# Patient Record
Sex: Female | Born: 1975 | Race: White | Hispanic: No | Marital: Married
Health system: Southern US, Community
[De-identification: ages and names within clinical notes are randomized; demographics above are authoritative.]

## PROBLEM LIST (undated history)

## (undated) DIAGNOSIS — N200 Calculus of kidney: Secondary | ICD-10-CM

## (undated) HISTORY — DX: Calculus of kidney: N20.0

---

## 2004-03-02 ENCOUNTER — Inpatient Hospital Stay: Payer: Self-pay | Admitting: Obstetrics & Gynecology

## 2006-11-04 ENCOUNTER — Encounter: Payer: Self-pay | Admitting: Maternal & Fetal Medicine

## 2006-11-28 ENCOUNTER — Observation Stay: Payer: Self-pay | Admitting: Unknown Physician Specialty

## 2006-12-02 ENCOUNTER — Encounter: Payer: Self-pay | Admitting: Maternal & Fetal Medicine

## 2007-01-05 ENCOUNTER — Observation Stay: Payer: Self-pay | Admitting: Unknown Physician Specialty

## 2007-01-06 ENCOUNTER — Ambulatory Visit: Payer: Self-pay | Admitting: Unknown Physician Specialty

## 2007-01-07 ENCOUNTER — Inpatient Hospital Stay: Payer: Self-pay | Admitting: Obstetrics and Gynecology

## 2007-06-02 ENCOUNTER — Emergency Department: Payer: Self-pay | Admitting: Emergency Medicine

## 2008-09-27 IMAGING — CR DG FOREARM 2V*L*
1 series · 2 of 2 positions shown · non-contrast
Comparison: none

REASON FOR EXAM: Fall
COMMENTS:

[Series 1: view not recorded · 0.17mm/px · 2 of 2 slices shown]
[im 1/2]
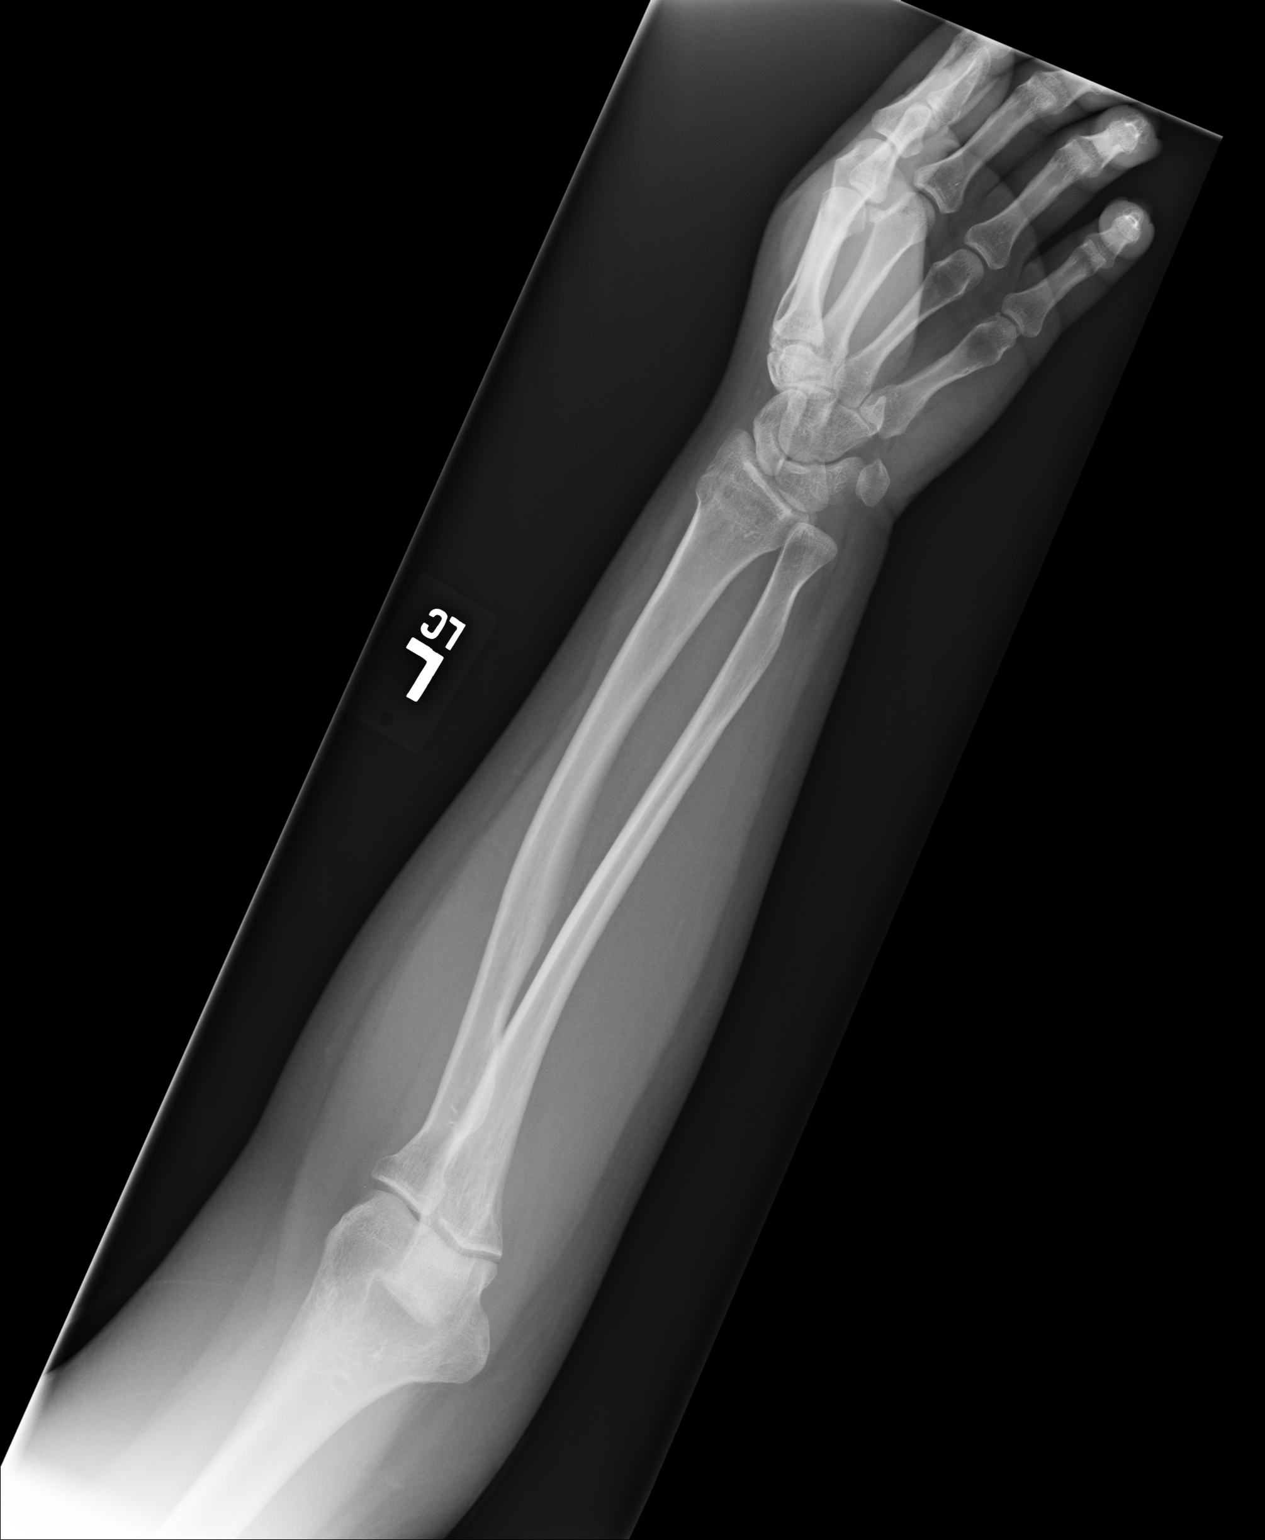
[im 2/2]
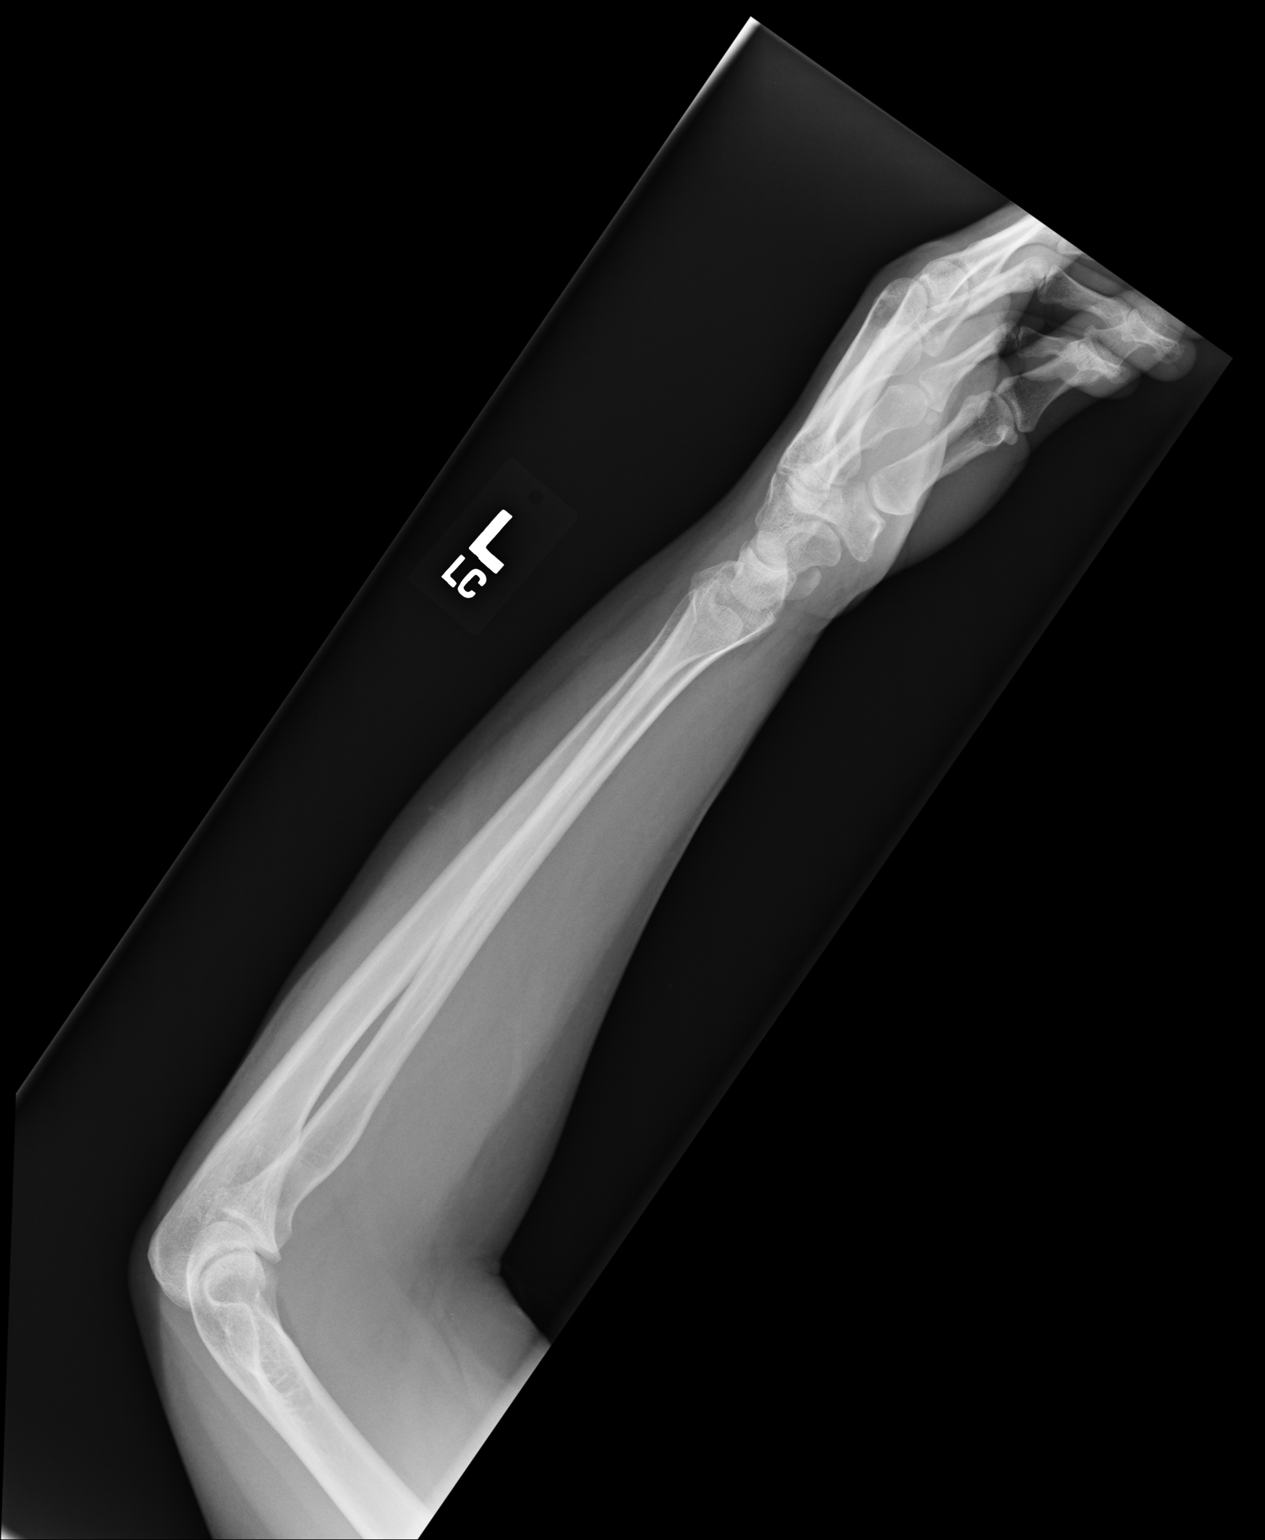

[2 of 2 positions shown; findings below may reference images not displayed]

PROCEDURE:     DXR - DXR FOREARM LEFT  - June 02, 2007 [DATE]

RESULT:     Two views of the forearm were obtained.

There is deformity of the distal LEFT radius. The findings could be
secondary to an impaction type fracture. The appearance also could be old
and secondary to residual change from prior trauma. Correlation with
clinical findings is needed. On the current exam, no definite associated
soft tissue swelling is seen. The osseous structures of the forearm
otherwise are normal in appearance.
IMPRESSION: There is deformity of the distal LEFT radius which is likely
old. Nonetheless, correlation with clinical findings is needed since an
acute impaction fracture cannot be totally excluded on the basis of this
exam. If the patient is symptomatic in this region, routine wrist views are
recommended.

## 2015-01-12 ENCOUNTER — Emergency Department: Payer: Managed Care, Other (non HMO)

## 2015-01-12 ENCOUNTER — Emergency Department
Admission: EM | Admit: 2015-01-12 | Discharge: 2015-01-12 | Disposition: A | Discharge status: Home / Self Care | Payer: Managed Care, Other (non HMO) | Attending: Emergency Medicine | Admitting: Emergency Medicine

## 2015-01-12 DIAGNOSIS — N200 Calculus of kidney: Secondary | ICD-10-CM | POA: Insufficient documentation

## 2015-01-12 DIAGNOSIS — R109 Unspecified abdominal pain: Secondary | ICD-10-CM | POA: Diagnosis present

## 2015-01-12 LAB — CBC WITH DIFFERENTIAL/PLATELET
BASOS ABS: 0.1 10*3/uL (ref 0–0.1)
BASOS PCT: 0 %
EOS PCT: 0 %
Eosinophils Absolute: 0 10*3/uL (ref 0–0.7)
HCT: 44 % (ref 35.0–47.0)
Hemoglobin: 14.3 g/dL (ref 12.0–16.0)
Lymphocytes Relative: 10 %
Lymphs Abs: 1.7 10*3/uL (ref 1.0–3.6)
MCH: 28.4 pg (ref 26.0–34.0)
MCHC: 32.5 g/dL (ref 32.0–36.0)
MCV: 87.6 fL (ref 80.0–100.0)
MONO ABS: 0.6 10*3/uL (ref 0.2–0.9)
MONOS PCT: 4 %
Neutro Abs: 13.6 10*3/uL — ABNORMAL HIGH (ref 1.4–6.5)
Neutrophils Relative %: 86 %
PLATELETS: 239 10*3/uL (ref 150–440)
RBC: 5.03 MIL/uL (ref 3.80–5.20)
RDW: 13 % (ref 11.5–14.5)
WBC: 16 10*3/uL — ABNORMAL HIGH (ref 3.6–11.0)

## 2015-01-12 LAB — URINALYSIS COMPLETE WITH MICROSCOPIC (ARMC ONLY)
BILIRUBIN URINE: NEGATIVE
Bacteria, UA: NONE SEEN
GLUCOSE, UA: NEGATIVE mg/dL
LEUKOCYTES UA: NEGATIVE
NITRITE: NEGATIVE
PROTEIN: 100 mg/dL — AB
SPECIFIC GRAVITY, URINE: 1.027 (ref 1.005–1.030)
pH: 5 (ref 5.0–8.0)

## 2015-01-12 LAB — COMPREHENSIVE METABOLIC PANEL
ALBUMIN: 4.2 g/dL (ref 3.5–5.0)
ALT: 22 U/L (ref 14–54)
ANION GAP: 9 (ref 5–15)
AST: 24 U/L (ref 15–41)
Alkaline Phosphatase: 60 U/L (ref 38–126)
BUN: 18 mg/dL (ref 6–20)
CHLORIDE: 110 mmol/L (ref 101–111)
CO2: 21 mmol/L — ABNORMAL LOW (ref 22–32)
Calcium: 8.9 mg/dL (ref 8.9–10.3)
Creatinine, Ser: 0.94 mg/dL (ref 0.44–1.00)
GFR calc Af Amer: >60 mL/min (ref >60)
Glucose, Bld: 143 mg/dL — ABNORMAL HIGH (ref 65–99)
POTASSIUM: 3.9 mmol/L (ref 3.5–5.1)
Sodium: 140 mmol/L (ref 135–145)
TOTAL PROTEIN: 7.5 g/dL (ref 6.5–8.1)
Total Bilirubin: 0.2 mg/dL — ABNORMAL LOW (ref 0.3–1.2)

## 2015-01-12 LAB — LIPASE, BLOOD: LIPASE: 31 U/L (ref 11–51)

## 2015-01-12 MED ORDER — SODIUM CHLORIDE 0.9 % IV BOLUS (SEPSIS)
1000.0000 mL | Freq: Once | INTRAVENOUS | Status: AC
  Administered 2015-01-12: 1000 mL via INTRAVENOUS

## 2015-01-12 MED ORDER — OXYCODONE-ACETAMINOPHEN 5-325 MG PO TABS
1.0000 | ORAL_TABLET | ORAL | Status: DC | PRN
Start: 2015-01-12 — End: 2023-12-12

## 2015-01-12 MED ORDER — ONDANSETRON HCL 4 MG/2ML IJ SOLN
INTRAMUSCULAR | Status: AC
  Administered 2015-01-12: 4 mg via INTRAVENOUS
  Filled 2015-01-12: qty 2

## 2015-01-12 MED ORDER — IOHEXOL 240 MG/ML SOLN
25.0000 mL | Freq: Once | INTRAMUSCULAR | Status: AC | PRN
  Administered 2015-01-12: 25 mL via ORAL

## 2015-01-12 MED ORDER — ONDANSETRON HCL 4 MG/2ML IJ SOLN
4.0000 mg | Freq: Once | INTRAMUSCULAR | Status: AC
  Administered 2015-01-12: 4 mg via INTRAVENOUS

## 2015-01-12 MED ORDER — ONDANSETRON 4 MG PO TBDP: 4.0000 mg | ORAL_TABLET | Freq: Three times a day (TID) | ORAL | Status: DC | PRN

## 2015-01-12 MED ORDER — IOHEXOL 350 MG/ML SOLN
100.0000 mL | Freq: Once | INTRAVENOUS | Status: AC | PRN
  Administered 2015-01-12: 100 mL via INTRAVENOUS

## 2015-01-12 MED ORDER — MORPHINE SULFATE (PF) 4 MG/ML IV SOLN
4.0000 mg | Freq: Once | INTRAVENOUS | Status: AC
  Administered 2015-01-12: 4 mg via INTRAVENOUS

## 2015-01-12 MED ORDER — MORPHINE SULFATE (PF) 4 MG/ML IV SOLN
INTRAVENOUS | Status: AC
  Administered 2015-01-12: 4 mg via INTRAVENOUS
  Filled 2015-01-12: qty 1

## 2015-01-12 MED ORDER — TAMSULOSIN HCL 0.4 MG PO CAPS: 0.4000 mg | ORAL_CAPSULE | Freq: Every day | ORAL | Status: DC

## 2015-01-12 NOTE — ED Notes (Signed)
Pt resting on stretcher in NAD.   She is sipping her contrast.  Husband at bedside.

## 2015-01-12 NOTE — Discharge Instructions (Signed)
Kidney Stones °Kidney stones (urolithiasis) are deposits that form inside your kidneys. The intense pain is caused by the stone moving through the urinary tract. When the stone moves, the ureter goes into spasm around the stone. The stone is usually passed in the urine.  °CAUSES  °· A disorder that makes certain neck glands produce too much parathyroid hormone (primary hyperparathyroidism). °· A buildup of uric acid crystals, similar to gout in your joints. °· Narrowing (stricture) of the ureter. °· A kidney obstruction present at birth (congenital obstruction). °· Previous surgery on the kidney or ureters. °· Numerous kidney infections. °SYMPTOMS  °· Feeling sick to your stomach (nauseous). °· Throwing up (vomiting). °· Blood in the urine (hematuria). °· Pain that usually spreads (radiates) to the groin. °· Frequency or urgency of urination. °DIAGNOSIS  °· Taking a history and physical exam. °· Blood or urine tests. °· CT scan. °· Occasionally, an examination of the inside of the urinary bladder (cystoscopy) is performed. °TREATMENT  °· Observation. °· Increasing your fluid intake. °· Extracorporeal shock wave lithotripsy--This is a noninvasive procedure that uses shock waves to break up kidney stones. °· Surgery may be needed if you have severe pain or persistent obstruction. There are various surgical procedures. Most of the procedures are performed with the use of small instruments. Only small incisions are needed to accommodate these instruments, so recovery time is minimized. °The size, location, and chemical composition are all important variables that will determine the proper choice of action for you. Talk to your health care provider to better understand your situation so that you will minimize the risk of injury to yourself and your kidney.  °HOME CARE INSTRUCTIONS  °· Drink enough water and fluids to keep your urine clear or pale yellow. This will help you to pass the stone or stone fragments. °· Strain  all urine through the provided strainer. Keep all particulate matter and stones for your health care provider to see. The stone causing the pain may be as small as a grain of salt. It is very important to use the strainer each and every time you pass your urine. The collection of your stone will allow your health care provider to analyze it and verify that a stone has actually passed. The stone analysis will often identify what you can do to reduce the incidence of recurrences. °· Only take over-the-counter or prescription medicines for pain, discomfort, or fever as directed by your health care provider. °· Keep all follow-up visits as told by your health care provider. This is important. °· Get follow-up X-rays if required. The absence of pain does not always mean that the stone has passed. It may have only stopped moving. If the urine remains completely obstructed, it can cause loss of kidney function or even complete destruction of the kidney. It is your responsibility to make sure X-rays and follow-ups are completed. Ultrasounds of the kidney can show blockages and the status of the kidney. Ultrasounds are not associated with any radiation and can be performed easily in a matter of minutes. °· Make changes to your daily diet as told by your health care provider. You may be told to: °¨ Limit the amount of salt that you eat. °¨ Eat 5 or more servings of fruits and vegetables each day. °¨ Limit the amount of meat, poultry, fish, and eggs that you eat. °· Collect a 24-hour urine sample as told by your health care provider. You may need to collect another urine sample every 6-12   months. °SEEK MEDICAL CARE IF: °· You experience pain that is progressive and unresponsive to any pain medicine you have been prescribed. °SEEK IMMEDIATE MEDICAL CARE IF:  °· Pain cannot be controlled with the prescribed medicine. °· You have a fever or shaking chills. °· The severity or intensity of pain increases over 18 hours and is not  relieved by pain medicine. °· You develop a new onset of abdominal pain. °· You feel faint or pass out. °· You are unable to urinate. °  °This information is not intended to replace advice given to you by your health care provider. Make sure you discuss any questions you have with your health care provider. °  °Document Released: 01/29/2005 Document Revised: 10/20/2014 Document Reviewed: 07/02/2012 °Elsevier Interactive Patient Education ©2016 [employer] ° °

## 2015-01-12 NOTE — ED Notes (Signed)
Pt back from CT.   She rang her call bell because she was having pain,   When RN entered room to assess, pt was lying on her side with eyes closed in NAD.  Pt aroused and said that she is comfortable at this time.  Call bell within reach and pt was instructed to call for any needs or concerns.

## 2015-01-12 NOTE — ED Provider Notes (Signed)
[facility] Emergency Department Provider Note  ____________________________________________  Time seen: 3:30 AM  I have reviewed the triage vital signs and the nursing notes.   HISTORY  Chief Complaint Abdominal Pain    HPI Alicia Oneal is a 39 y.o. female presents with acute onset of 10 out of 10 left side abdominal pain is currently 10 out of 10 accompanied by nonbloody emesis. Patient denies any fever no nausea vomiting no dysuria or hematuria.   Past medical history None There are no active problems to display for this patient.  Past surgical history None No current outpatient prescriptions on file.  Allergies Benadryl  No family history on file.  Social History Social History  Substance Use Topics  . Smoking status: None  . Smokeless tobacco: None  . Alcohol Use: None    Review of Systems  Constitutional: Negative for fever. Eyes: Negative for visual changes. ENT: Negative for sore throat. Cardiovascular: Negative for chest pain. Respiratory: Negative for shortness of breath. Gastrointestinal: Negative for abdominal pain, vomiting and diarrhea. Positive left flank pain Genitourinary: Negative for dysuria. Musculoskeletal: Negative for back pain. Skin: Negative for rash. Neurological: Negative for headaches, focal weakness or numbness.   10-point ROS otherwise negative.  ____________________________________________   PHYSICAL EXAM:  VITAL SIGNS: ED Triage Vitals  Enc Vitals Group     BP 01/12/15 0257 74/47 mmHg     Pulse Rate 01/12/15 0257 68     Resp 01/12/15 0257 18     Temp 01/12/15 0257 97.5 F (36.4 C)     Temp Source 01/12/15 0257 Oral     SpO2 01/12/15 0445 100 %     Weight 01/12/15 0257 215 lb (97.523 kg)     Height 01/12/15 0257 5\' 5"  (1.651 m)     Head Cir --      Peak Flow --      Pain Score 01/12/15 0258 10     Pain Loc --      Pain Edu? --      Excl. in GC? --      Constitutional: Alert and  oriented. Apparent distress Eyes: Conjunctivae are normal. PERRL. Normal extraocular movements. ENT   Head: Normocephalic and atraumatic.   Nose: No congestion/rhinnorhea.   Mouth/Throat: Mucous membranes are moist.   Neck: No stridor. Hematological/Lymphatic/Immunilogical: No cervical lymphadenopathy. Cardiovascular: Normal rate, regular rhythm. Normal and symmetric distal pulses are present in all extremities. No murmurs, rubs, or gallops. Respiratory: Normal respiratory effort without tachypnea nor retractions. Breath sounds are clear and equal bilaterally. No wheezes/rales/rhonchi. Gastrointestinal: Soft and nontender. No distention. There is no CVA tenderness. Genitourinary: deferred Musculoskeletal: Nontender with normal range of motion in all extremities. No joint effusions.  No lower extremity tenderness nor edema. Neurologic:  Normal speech and language. No gross focal neurologic deficits are appreciated. Speech is normal.  Skin:  Skin is warm, dry and intact. No rash noted. Psychiatric: Mood and affect are normal. Speech and behavior are normal. Patient exhibits appropriate insight and judgment.  ____________________________________________    LABS (pertinent positives/negatives)  Labs Reviewed  CBC WITH DIFFERENTIAL/PLATELET - Abnormal; Notable for the following:    WBC 16.0 (*)    Neutro Abs 13.6 (*)    All other components within normal limits  COMPREHENSIVE METABOLIC PANEL - Abnormal; Notable for the following:    CO2 21 (*)    Glucose, Bld 143 (*)    Total Bilirubin 0.2 (*)    All other components within normal limits  URINALYSIS COMPLETEWITH MICROSCOPIC ([facility] ONLY) - Abnormal; Notable for the following:    Color, Urine AMBER (*)    APPearance CLOUDY (*)    Ketones, ur TRACE (*)    Hgb urine dipstick 3+ (*)    Protein, ur 100 (*)    Squamous Epithelial / LPF 0-5 (*)    All other components within normal limits  LIPASE, BLOOD     RADIOLOGY   CT  Abdomen Pelvis W Contrast (Final result) Result time: 01/12/15 05:57:49   Final result by Rad Results In Interface (01/12/15 05:57:49)   Narrative:   CLINICAL DATA: Left-sided abdominal pain, onset tonight. Vomiting.  EXAM: CT ABDOMEN AND PELVIS WITH CONTRAST  TECHNIQUE: Multidetector CT imaging of the abdomen and pelvis was performed using the standard protocol following bolus administration of intravenous contrast.  CONTRAST: OMNIPAQUE IOHEXOL 350 MG/ML SOLN  COMPARISON: None.  FINDINGS: There is a 6 x 7 mm left ureteral calculus at the L3 level with marked hydronephrosis and periureteral stranding. No other acute findings are evident in the abdomen or pelvis. No other urinary calculi are evident.  There are normal appearances of the liver, gallbladder, bile ducts, pancreas, spleen, adrenals and right kidney. The abdominal aorta is normal in caliber. There is no atherosclerotic calcification. There is no adenopathy in the abdomen or pelvis. There is a small hiatal hernia. Bowel is otherwise unremarkable. Uterus and ovaries appear unremarkable. There is no significant abnormality in the lower chest. There is no significant musculoskeletal abnormality. Incidentally noted small fat containing umbilical hernia.  IMPRESSION: 1. Obstructing 6 x 7 mm left ureteral calculus at the L3 level with hydronephrosis. 2. Small hiatal hernia. 3. Small fat containing umbilical hernia.   Electronically Signed By: Ellery Plunk M.D. On: 01/12/2015 05:57           INITIAL IMPRESSION / ASSESSMENT AND PLAN / ED COURSE  Pertinent labs & imaging results that were available during my care of the patient were reviewed by me and considered in my medical decision making (see chart for details).  History & physical exam, CT scan consistent with acute left ureterolithiasis. Patient states pain resolved following IV morphine nausea resolved following  Zofran.  ____________________________________________   FINAL CLINICAL IMPRESSION(S) / ED DIAGNOSES  Final diagnoses:  Kidney stone on left side      Darci Current, MD 01/12/15 [phone]

## 2015-01-12 NOTE — ED Notes (Signed)
PT in with co left sided abd pain that started tonight along with vomiting, pt screaming in triage and restless.

## 2015-01-12 NOTE — ED Notes (Signed)

## 2015-01-12 NOTE — ED Notes (Signed)
Dr. Manson PasseyBrown in to discuss results with pt

## 2022-09-11 DIAGNOSIS — Z0000 Encounter for general adult medical examination without abnormal findings: Secondary | ICD-10-CM | POA: Diagnosis not present

## 2022-09-11 DIAGNOSIS — R030 Elevated blood-pressure reading, without diagnosis of hypertension: Secondary | ICD-10-CM | POA: Diagnosis not present

## 2023-11-28 ENCOUNTER — Ambulatory Visit

## 2023-11-29 ENCOUNTER — Ambulatory Visit

## 2023-12-12 ENCOUNTER — Ambulatory Visit

## 2023-12-12 VITALS — BP 127/72 | HR 83 | Ht 65.0 in | Wt 227.8 lb

## 2023-12-12 DIAGNOSIS — N951 Menopausal and female climacteric states: Secondary | ICD-10-CM | POA: Diagnosis not present

## 2023-12-12 DIAGNOSIS — E669 Obesity, unspecified: Secondary | ICD-10-CM | POA: Insufficient documentation

## 2023-12-12 DIAGNOSIS — E66812 Obesity, class 2: Secondary | ICD-10-CM | POA: Diagnosis not present

## 2023-12-12 DIAGNOSIS — Z87442 Personal history of urinary calculi: Secondary | ICD-10-CM | POA: Diagnosis not present

## 2023-12-12 DIAGNOSIS — Z833 Family history of diabetes mellitus: Secondary | ICD-10-CM | POA: Insufficient documentation

## 2023-12-12 DIAGNOSIS — I10 Essential (primary) hypertension: Secondary | ICD-10-CM | POA: Insufficient documentation

## 2023-12-12 DIAGNOSIS — Z6837 Body mass index (BMI) 37.0-37.9, adult: Secondary | ICD-10-CM

## 2023-12-12 DIAGNOSIS — E6609 Other obesity due to excess calories: Secondary | ICD-10-CM

## 2023-12-12 NOTE — Progress Notes (Signed)
 New Patient Visit   Physician: Janda Cargo A Hendrix Console, MD  Patient: Alicia Oneal   DOB: May 28, 1975   48 y.o. Female  MRN: [id number] Visit Date: 12/12/2023   Chief Complaint  Patient presents with   Establish Care   Subjective  Alicia Oneal is a 48 y.o. female who presents today as a new patient to establish care.   HPI  Discussed the use of AI scribe software for clinical note transcription with the patient, who gave verbal consent to proceed.  History of Present Illness   Alicia Oneal is a 48 year old female who presents for a routine check-up and blood work.  Gynecological symptoms - Menstrual periods have become irregular, with a gap from July to September and resumption in October - No hot flashes - No mood swings or other perimenopausal symptoms  - Last PAP 3-4 years prior.  No history of irregular  Renal history - Kidney stone in 2016 - No recurrence of nephrolithiasis since 2016  Surgical history and sequelae - Four prior cesarean sections - Appendectomy with complications from a drain affecting the small intestines - No ongoing issues from prior surgeries aside from a scar  Blood pressure monitoring - Home blood pressure readings are typically normal - 120's systolic with normal diastolic pressures - Elevated blood pressure readings in clinical settings - No antihypertensive medications used  Lifestyle and preventive health - Engages in walking and high-intensity workouts - Diet is rich in vegetables and fruits - Avoids processed foods and artificial sweeteners - Uses natural remedies such as teas with turmeric, ginger, and honey for minor ailments         ASSESSMENT & PLAN  Encounter Diagnoses  Name Primary?   Class 2 obesity due to excess calories with body mass index (BMI) of 37.0 to 37.9 in adult, unspecified whether serious comorbidity present Yes   FH: type 2 diabetes     Orders Placed This Encounter  Procedures   CBC with  Differential/Platelet   Comprehensive metabolic panel with GFR   Hemoglobin A1c   Lipid panel   Urinalysis, Routine w reflex microscopic    Assessment and Plan    White coat hypertension Blood pressure elevated in office, normal at home, indicating white coat hypertension. - Monitor blood pressure at home.  Perimenopause Irregular periods suggest perimenopause.  Will monitor  Diabetes risk Family history of type 2 diabetes. - Order blood work including hemoglobin A1c.  Breast cancer screening Has not had a mammogram yet, performs regular self-breast exams. - Encourage regular self-breast exams and consider mammogram screening.  She does not want to do it at this time  Cervical cancer screening Discussed HPV screening as an alternative to Pap smear. - Discuss HPV screening as an alternative to Pap smear.  General health maintenance Physically active, follows a healthy diet, avoids smoking, alcohol, and drugs.  Obesity - Encourage continuation of healthy lifestyle, including exercise and a balanced diet.  BMI 37.9.  We will discuss at future appointments.   Influenza vaccine declined       Objective  BP (!) 130/98   Pulse 83   Ht 5' 5 (1.651 m)   Wt 227 lb 12.8 oz (103.3 kg)   LMP 11/29/2023 (Approximate)   SpO2 98%   BMI 37.91 kg/m      Review of Systems  Constitutional:  Negative for chills, fever and weight loss.  Eyes:  Negative for blurred vision. h Respiratory:  Negative for cough and  shortness of breath.   Cardiovascular:  Negative for chest pain and palpitations.  Skin:  Negative for rash.  Psychiatric/Behavioral:  Negative for depression. The patient is not nervous/anxious.      Physical Exam Physical Exam Vitals reviewed.  Constitutional:      Appearance: Normal appearance. Well-developed with normal weight.  HENT:     Head: Normocephalic and atraumatic.  Normal mucous membranes, no oral lesions Eyes:     Pupils: Pupils are equal, round,  and reactive to light.  Neck:     Thyroid: No thyroid mass or thyromegaly.  Cardiovascular:     Rate and Rhythm: Normal rate and regular rhythm. Normal heart sounds. Normal peripheral pulses Pulmonary:     Normal breath sounds with normal effort Abdominal:   Abdomen is soft, without tenderness or noted hepatosplenomegaly Musculoskeletal:        General: No swelling or edema  Lymphadenopathy:     Cervical: No cervical adenopathy.  Skin:    General: Skin is warm and dry without noticeable rash. Neurological:     General: No focal deficit present.  Psychiatric:        Mood and Affect: Mood, behavior and cognition normal   History reviewed. No pertinent past medical history. History reviewed. No pertinent surgical history. No family status information on file.   History reviewed. No pertinent family history. Social History   Socioeconomic History   Marital status: Married    Spouse name: Not on file   Number of children: Not on file   Years of education: Not on file   Highest education level: Not on file  Occupational History   Not on file  Tobacco Use   Smoking status: Not on file   Smokeless tobacco: Not on file  Substance and Sexual Activity   Alcohol use: Not on file   Drug use: Not on file   Sexual activity: Not on file  Other Topics Concern   Not on file  Social History Narrative   Not on file   Social Drivers of Health   Financial Resource Strain: Not on file  Food Insecurity: Not on file  Transportation Needs: Not on file  Physical Activity: Not on file  Stress: Not on file  Social Connections: Not on file   Outpatient Medications Prior to Visit  Medication Sig   ondansetron  (ZOFRAN  ODT) 4 MG disintegrating tablet Take 1 tablet (4 mg total) by mouth every 8 (eight) hours as needed for nausea or vomiting.   oxyCODONE -acetaminophen  (ROXICET) 5-325 MG tablet Take 1 tablet by mouth every 4 (four) hours as needed for severe pain.   tamsulosin  (FLOMAX ) 0.4 MG  CAPS capsule Take 1 capsule (0.4 mg total) by mouth daily after breakfast.   No facility-administered medications prior to visit.   Allergies  Allergen Reactions   Benadryl [Diphenhydramine Hcl (Sleep)] Hives    Immunization History  Administered Date(s) Administered   PFIZER(Purple Top)SARS-COV-2 Vaccination 07/22/2019, 08/12/2019    Health Maintenance  Topic Date Due   HIV Screening  Never done   Hepatitis C Screening  Never done   DTaP/Tdap/Td (1 - Tdap) Never done   Hepatitis B Vaccines 19-59 Average Risk (1 of 3 - 19+ 3-dose series) Never done   Mammogram  Never done   Colonoscopy  Never done   Influenza Vaccine  Never done   COVID-19 Vaccine (3 - 2025-26 season) 10/14/2023   Cervical Cancer Screening (HPV/Pap Cotest)  09/02/2024   Pneumococcal Vaccine  Aged Out   HPV  VACCINES  Aged Out   Meningococcal B Vaccine  Aged Out    Patient Care Team: Everlene Parris LABOR, MD as PCP - General (Family Medicine)  Depression Screen     No data to display           Parris LABOR Everlene, MD  [facility] [phone] (phone) [phone] (fax)  [facility]: [facility]

## 2023-12-14 LAB — COMPREHENSIVE METABOLIC PANEL WITH GFR
AG Ratio: 1.5 (calc) (ref 1.0–2.5)
ALT: 18 U/L (ref 6–29)
AST: 15 U/L (ref 10–35)
Albumin: 4.4 g/dL (ref 3.6–5.1)
Alkaline phosphatase (APISO): 74 U/L (ref 31–125)
BUN: 8 mg/dL (ref 7–25)
CO2: 23 mmol/L (ref 20–32)
Calcium: 9.5 mg/dL (ref 8.6–10.2)
Chloride: 105 mmol/L (ref 98–110)
Creat: 0.76 mg/dL (ref 0.50–0.99)
Globulin: 2.9 g/dL (ref 1.9–3.7)
Glucose, Bld: 87 mg/dL (ref 65–139)
Potassium: 4.4 mmol/L (ref 3.5–5.3)
Sodium: 140 mmol/L (ref 135–146)
Total Bilirubin: 0.7 mg/dL (ref 0.2–1.2)
Total Protein: 7.3 g/dL (ref 6.1–8.1)
eGFR: 97 mL/min/1.73m2 (ref >=60)

## 2023-12-14 LAB — CBC WITH DIFFERENTIAL/PLATELET
Absolute Lymphocytes: 2530 {cells}/uL (ref 850–3900)
Absolute Monocytes: 840 {cells}/uL (ref 200–950)
Basophils Absolute: 81 {cells}/uL (ref 0–200)
Basophils Relative: 0.7 %
Eosinophils Absolute: 115 {cells}/uL (ref 15–500)
Eosinophils Relative: 1 %
HCT: 45.4 % — ABNORMAL HIGH (ref 35.0–45.0)
Hemoglobin: 14.8 g/dL (ref 11.7–15.5)
MCH: 29 pg (ref 27.0–33.0)
MCHC: 32.6 g/dL (ref 32.0–36.0)
MCV: 88.8 fL (ref 80.0–100.0)
MPV: 12.4 fL (ref 7.5–12.5)
Monocytes Relative: 7.3 %
Neutro Abs: 7935 {cells}/uL — ABNORMAL HIGH (ref 1500–7800)
Neutrophils Relative %: 69 %
Platelets: 305 Thousand/uL (ref 140–400)
RBC: 5.11 Million/uL — ABNORMAL HIGH (ref 3.80–5.10)
RDW: 12.2 % (ref 11.0–15.0)
Total Lymphocyte: 22 %
WBC: 11.5 Thousand/uL — ABNORMAL HIGH (ref 3.8–10.8)

## 2023-12-14 LAB — HEMOGLOBIN A1C
Hgb A1c MFr Bld: 5.4 % (ref <5.7)
Mean Plasma Glucose: 108 mg/dL
eAG (mmol/L): 6 mmol/L

## 2023-12-14 LAB — LIPID PANEL
Cholesterol: 183 mg/dL (ref <200)
HDL: 57 mg/dL (ref >=50)
LDL Cholesterol (Calc): 95 mg/dL
Non-HDL Cholesterol (Calc): 126 mg/dL (ref <130)
Total CHOL/HDL Ratio: 3.2 (calc) (ref <5.0)
Triglycerides: 223 mg/dL — ABNORMAL HIGH (ref <150)

## 2023-12-14 LAB — URINALYSIS, ROUTINE W REFLEX MICROSCOPIC
Bilirubin Urine: NEGATIVE
Glucose, UA: NEGATIVE
Hgb urine dipstick: NEGATIVE
Ketones, ur: NEGATIVE
Leukocytes,Ua: NEGATIVE
Nitrite: NEGATIVE
Protein, ur: NEGATIVE
Specific Gravity, Urine: 1.003 (ref 1.001–1.035)
pH: 7 (ref 5.0–8.0)

## 2024-01-02 ENCOUNTER — Ambulatory Visit

## 2024-01-02 VITALS — BP 119/75 | HR 94 | Ht 65.0 in | Wt 129.2 lb

## 2024-01-02 DIAGNOSIS — D72829 Elevated white blood cell count, unspecified: Secondary | ICD-10-CM | POA: Diagnosis not present

## 2024-01-02 DIAGNOSIS — E669 Obesity, unspecified: Secondary | ICD-10-CM | POA: Diagnosis not present

## 2024-01-02 NOTE — Progress Notes (Signed)
            Progress Note  Physician: Ishan Sanroman A Kunio Cummiskey, MD   HPI: Alicia Oneal is a 48 y.o. female presenting on 01/02/2024 for Follow-up .  Discussed the use of AI scribe software for clinical note transcription with the patient, who gave verbal consent to proceed.  History of Present Illness   Alicia Oneal is a 48 year old female who presents for a routine follow-up visit.  Weight management and physical activity - Weight decreased from 227 pounds to 219 pounds - Increased physical activity, including walking more with her dog - Utilizes faith-based coping strategies for stress and anxiety - Proud of recent weight loss achievements  Blood pressure monitoring - Home blood pressure readings average 119/75 mmHg, lower than typical in-office measurements - Regular home blood pressure monitoring - Elevated blood pressure readings occur in the clinic setting  Laboratory findings - Cholesterol levels within normal range despite non-fasting state during testing - Slightly elevated white blood cell count on recent labs - No recent infections or illnesses - Family history of diabetes, she has normal HgbA1C         Medical history:  Relevant past medical, surgical, family and social history reviewed and updated as indicated. Interim medical history since our last visit reviewed.  Allergies and medications reviewed and updated.   ROS: Negative unless specifically indicated above in HPI.   No current outpatient medications on file.       Objective:     BP 119/75 (Cuff Size: Normal)   Pulse 94   Ht 5' 5 (1.651 m)   Wt 129 lb 3.2 oz (58.6 kg)   LMP 12/26/2023   SpO2 96%   BMI 21.50 kg/m   Wt Readings from Last 3 Encounters:  01/02/24 129 lb 3.2 oz (58.6 kg)  12/12/23 227 lb 12.8 oz (103.3 kg)  01/12/15 215 lb (97.5 kg)    Physical Exam  Physical Exam Vitals reviewed.  Constitutional:      Appearance: Normal appearance. Well-developed with normal  weight.  Cardiovascular:     Rate and Rhythm: Normal rate and regular rhythm. Normal heart sounds. Normal peripheral pulses Pulmonary:     Normal breath sounds with normal effort Skin:    General: Skin is warm and dry without noticeable rash. Neurological:     General: No focal deficit present.  Psychiatric:        Mood and Affect: Mood, behavior and cognition normal      Assessment & Plan:   Encounter Diagnoses  Name Primary?   Leukocytosis, unspecified type Yes   Obesity without serious comorbidity, unspecified class, unspecified obesity type     Orders Placed This Encounter  Procedures   CBC with Differential/Platelet     Assessment and Plan    Adult Wellness  Weight loss achieved through lifestyle changes. - Continue current lifestyle modifications for weight management. - Schedule Pap smear or HPV screening at a separate visit.  Elevated white blood cell count, monitoring Mildly elevated white blood cell count, likely due to transient infection. No symptoms reported. - Repeat CBC in January to monitor white blood cell count.  Overweight Weight reduced from 227 lbs to 219 lbs through increased physical activity and lifestyle changes. - Continue current exercise regimen and lifestyle modifications.      Plan to see annually or as needed in the interm

## 2024-12-25 ENCOUNTER — Other Ambulatory Visit

## 2025-01-01 ENCOUNTER — Ambulatory Visit
